# Patient Record
Sex: Female | Born: 1960 | Hispanic: Yes | Marital: Married
Health system: Southern US, Community
[De-identification: ages and names within clinical notes are randomized; demographics above are authoritative.]

---

## 2011-03-07 ENCOUNTER — Observation Stay: Payer: Self-pay | Admitting: Internal Medicine

## 2011-03-07 LAB — CBC
MCHC: 32.9 g/dL (ref 32.0–36.0)
Platelet: 202 10*3/uL (ref 150–440)
RDW: 13.4 % (ref 11.5–14.5)
WBC: 14.9 10*3/uL — ABNORMAL HIGH (ref 3.6–11.0)

## 2011-03-07 LAB — BASIC METABOLIC PANEL
Anion Gap: 12 (ref 7–16)
BUN: 10 mg/dL (ref 7–18)
Creatinine: 0.71 mg/dL (ref 0.60–1.30)
EGFR (African American): >60
EGFR (Non-African Amer.): >60
Glucose: 194 mg/dL — ABNORMAL HIGH (ref 65–99)
Sodium: 140 mmol/L (ref 136–145)

## 2011-03-07 LAB — LIPASE, BLOOD: Lipase: 174 U/L (ref 73–393)

## 2011-03-07 LAB — PREGNANCY, URINE: Pregnancy Test, Urine: NEGATIVE m[IU]/mL

## 2011-03-08 LAB — BASIC METABOLIC PANEL
Anion Gap: 9 (ref 7–16)
BUN: 7 mg/dL (ref 7–18)
Chloride: 103 mmol/L (ref 98–107)
Co2: 25 mmol/L (ref 21–32)
EGFR (African American): >60
EGFR (Non-African Amer.): >60
Glucose: 115 mg/dL — ABNORMAL HIGH (ref 65–99)
Osmolality: 273 (ref 275–301)
Potassium: 3 mmol/L — ABNORMAL LOW (ref 3.5–5.1)
Sodium: 137 mmol/L (ref 136–145)

## 2011-03-08 LAB — CBC WITH DIFFERENTIAL/PLATELET
Basophil #: 0 10*3/uL (ref 0.0–0.1)
Basophil %: 0.3 %
Eosinophil #: 0.1 10*3/uL (ref 0.0–0.7)
Eosinophil %: 0.4 %
HCT: 33.5 % — ABNORMAL LOW (ref 35.0–47.0)
Lymphocyte #: 3 10*3/uL (ref 1.0–3.6)
Lymphocyte %: 21.9 %
MCH: 30 pg (ref 26.0–34.0)
MCHC: 33.4 g/dL (ref 32.0–36.0)
Monocyte #: 0.6 10*3/uL (ref 0.0–0.7)
Neutrophil #: 9.9 10*3/uL — ABNORMAL HIGH (ref 1.4–6.5)
Neutrophil %: 72.6 %
RDW: 13 % (ref 11.5–14.5)
WBC: 13.6 10*3/uL — ABNORMAL HIGH (ref 3.6–11.0)

## 2011-03-08 LAB — MAGNESIUM: Magnesium: 1.6 mg/dL — ABNORMAL LOW

## 2013-04-15 IMAGING — CR DG CHEST 2V
1 series · 2 of 2 positions shown · non-contrast
Comparison: none

REASON FOR EXAM: CHEST PAIN
COMMENTS:

[Series 1: lat · 0.17mm/px · 2 of 2 slices shown]
[im 1/2]
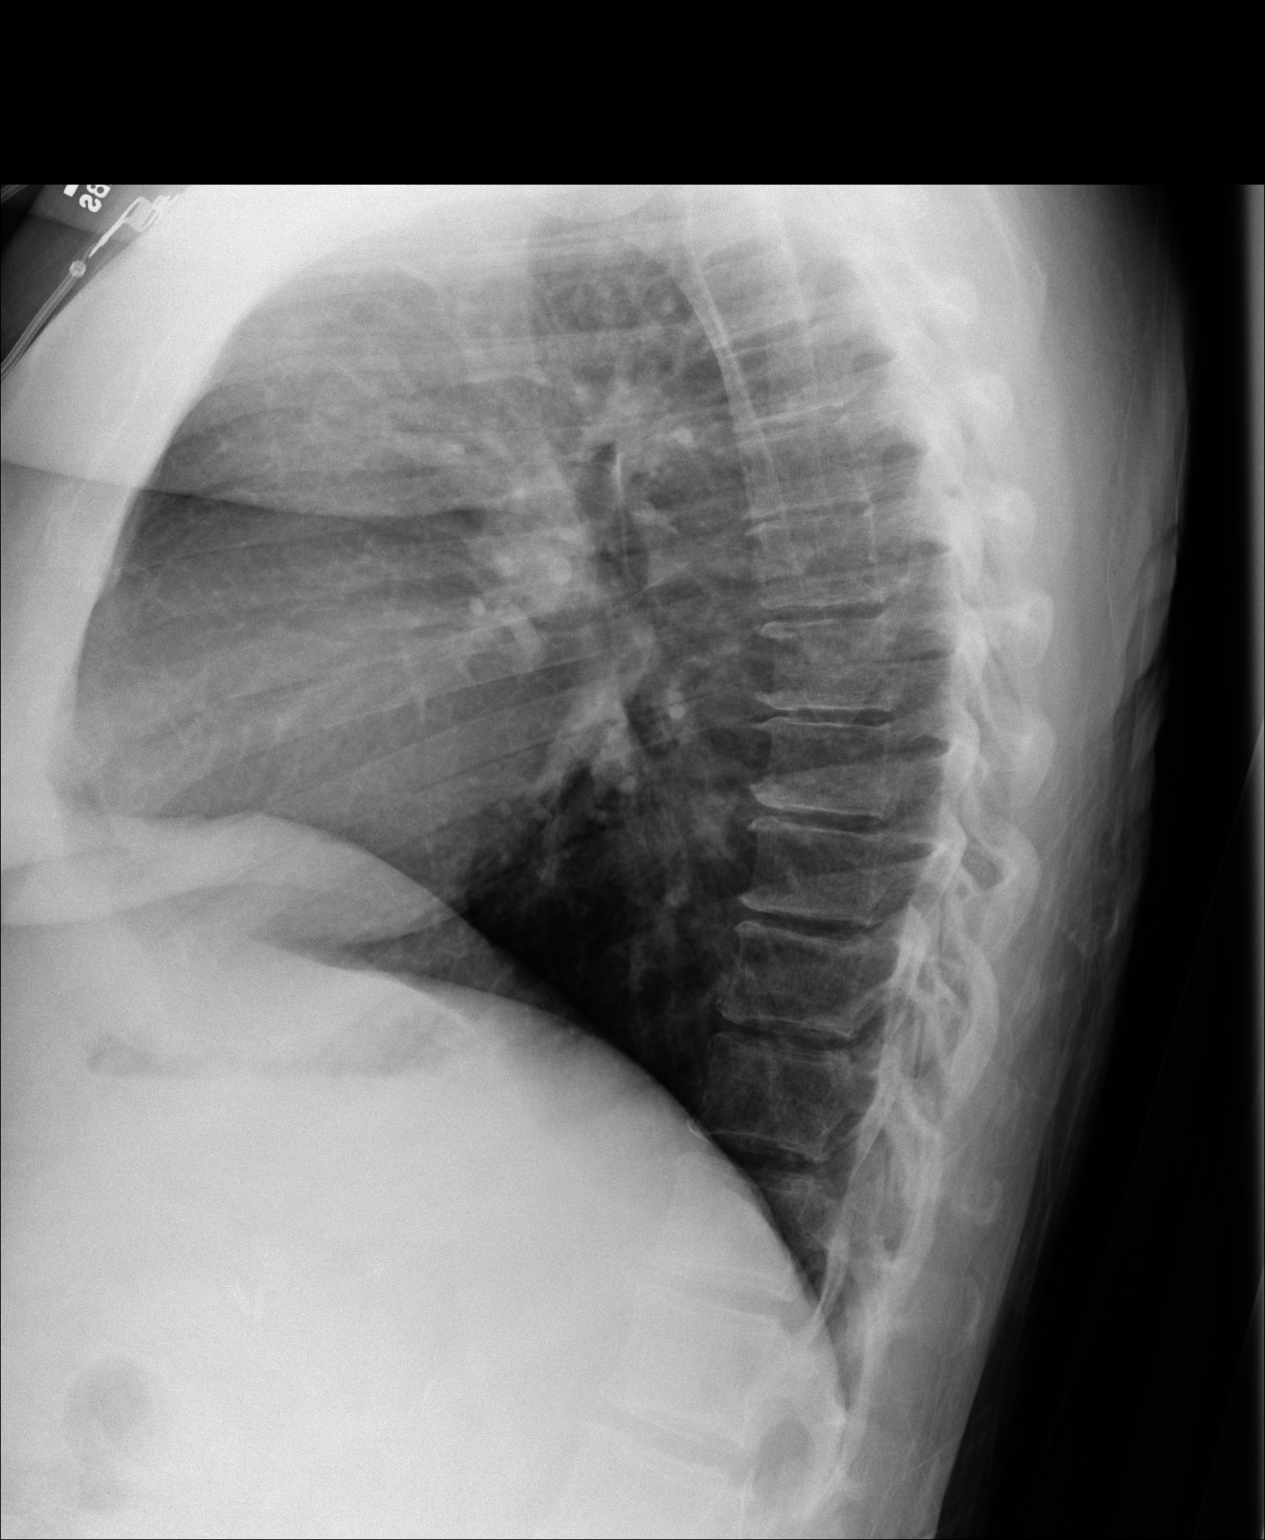
[im 2/2]
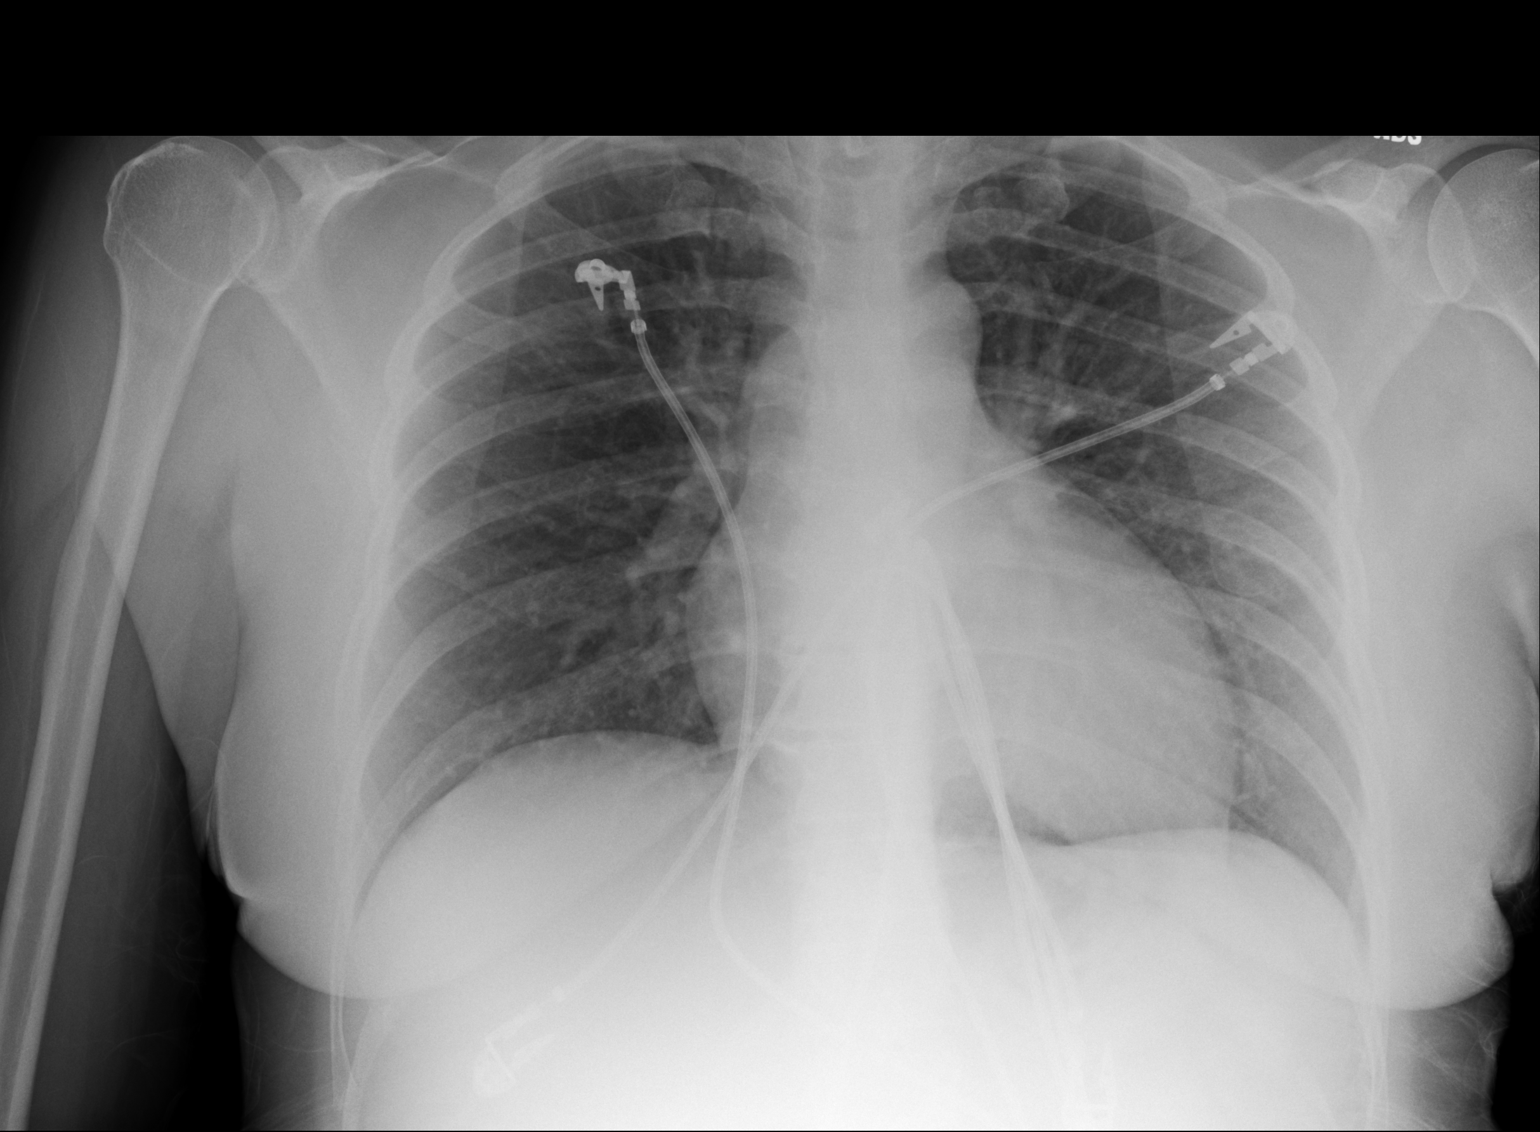

[2 of 2 positions shown; findings below may reference images not displayed]

PROCEDURE:     DXR - DXR CHEST PA (OR AP) AND LATERAL  - March 07, 2011  [DATE]

RESULT:     A cardiac monitoring electrodes are present. There is no
previous exam for comparison. The lungs are clear. The heart and pulmonary
vessels are normal. The bony and mediastinal structures are unremarkable.
There is no effusion. There is no pneumothorax or evidence of congestive
failure.
IMPRESSION: No acute cardiopulmonary disease.

## 2013-04-15 IMAGING — CT CT CHEST W/ CM
1 series · 15 of 31 positions shown, 19 images · IV contrast (APPLIED)
Comparison: none

REASON FOR EXAM: PLEURITIC CHEST PAIN AND ELEVATED DDIMER
COMMENTS:

[Series 4: soft tissue · axial · 0.79mm/px · z∈[-186,+42]mm · 15 of 84 slices shown, 19 images]
[im 4/84  mediastinal]
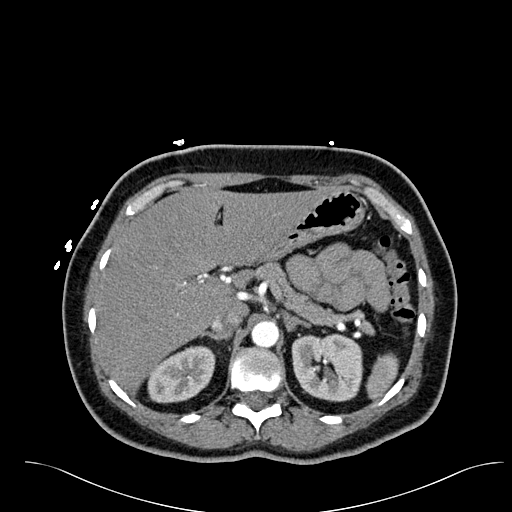
[im 4/84  lung]
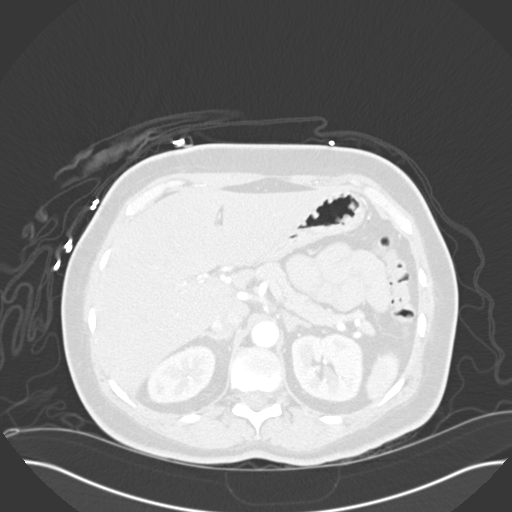
[im 10/84  lung]
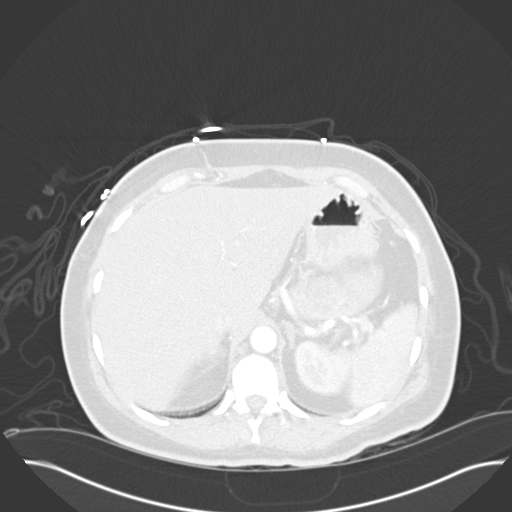
[im 16/84  lung]
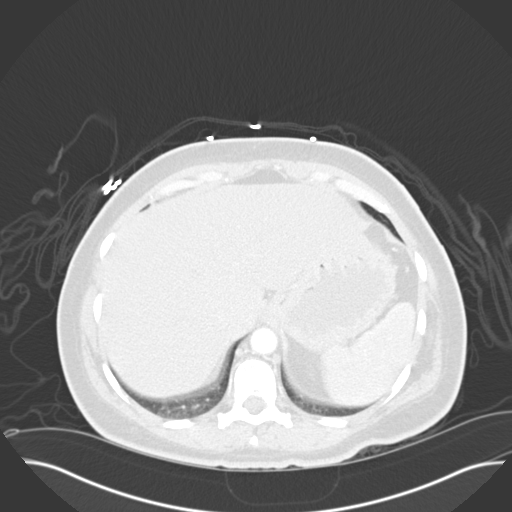
[im 19/84  lung]
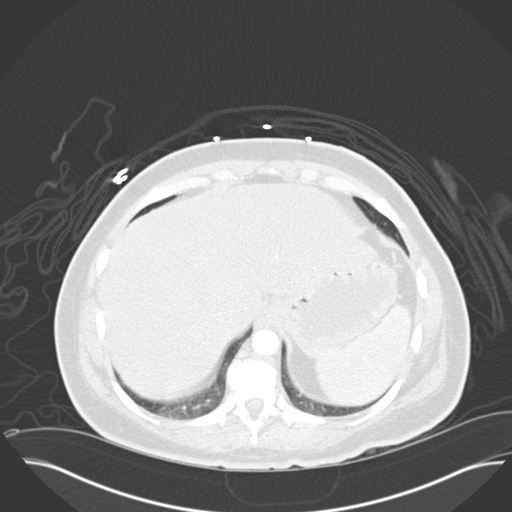
[im 25/84  mediastinal]
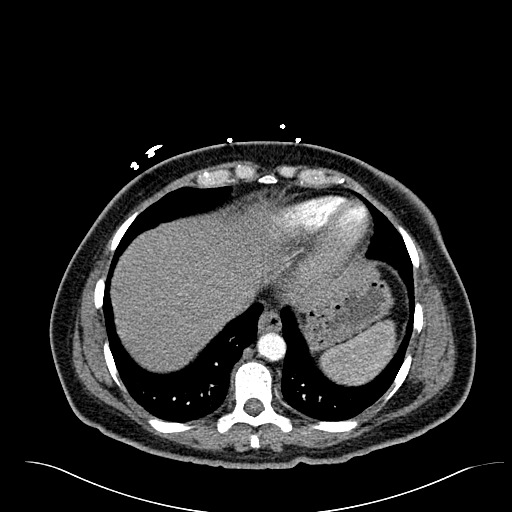
[im 25/84  lung]
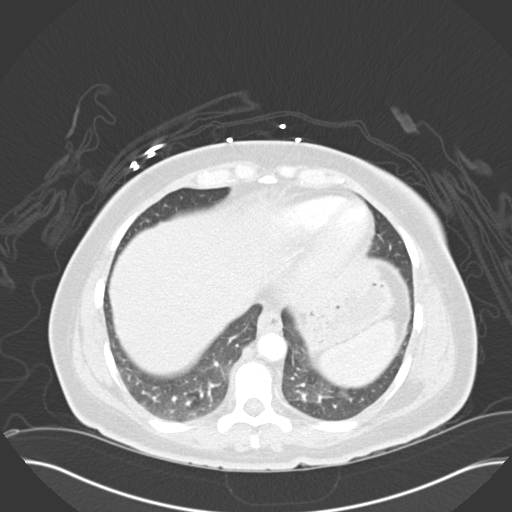
[im 31/84  lung]
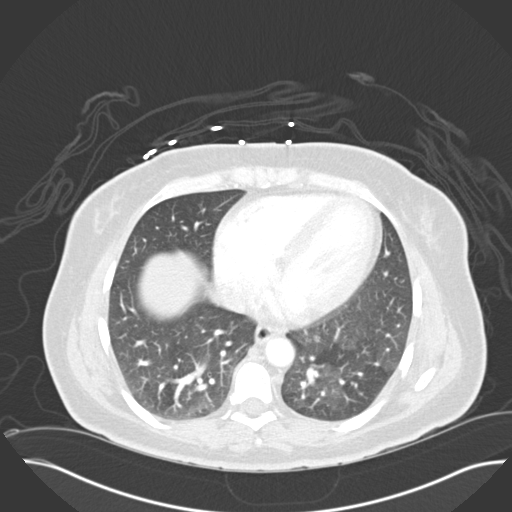
[im 37/84  lung]
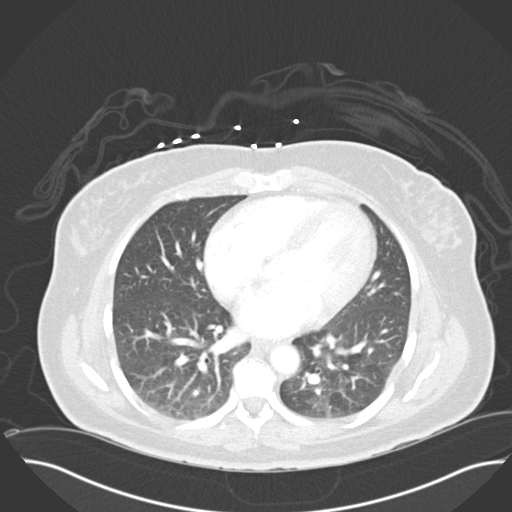
[im 44/84  lung]
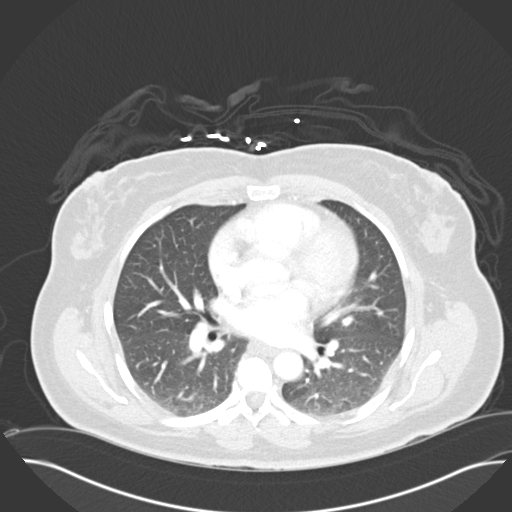
[im 47/84  mediastinal]
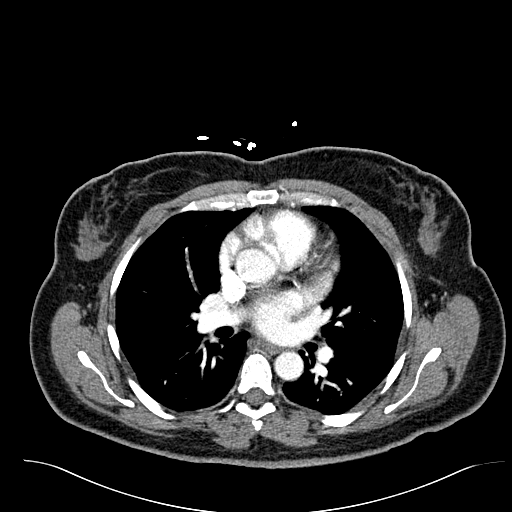
[im 47/84  lung]
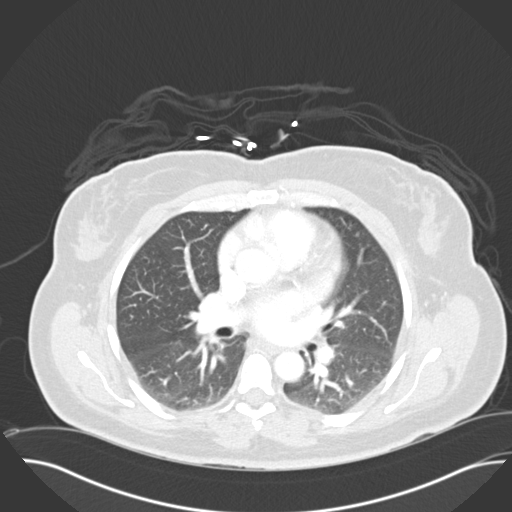
[im 53/84  lung]
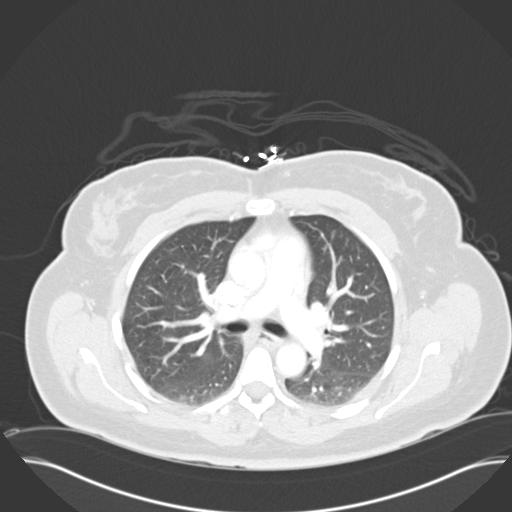
[im 59/84  lung]
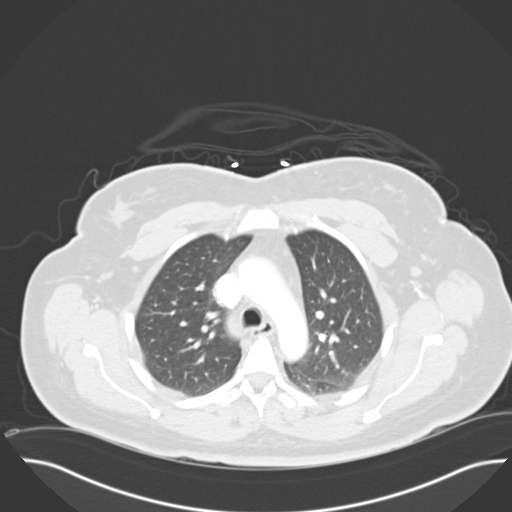
[im 65/84  lung]
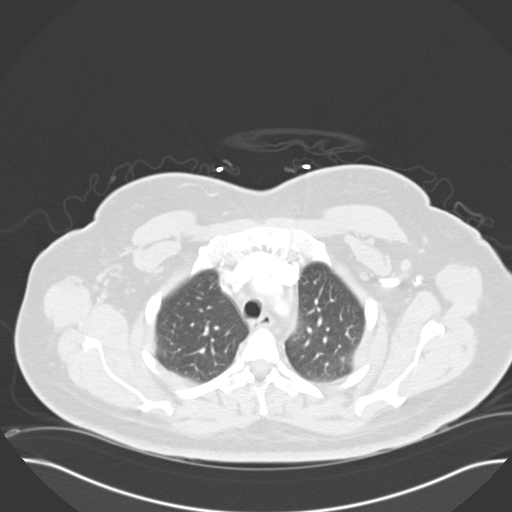
[im 68/84  mediastinal]
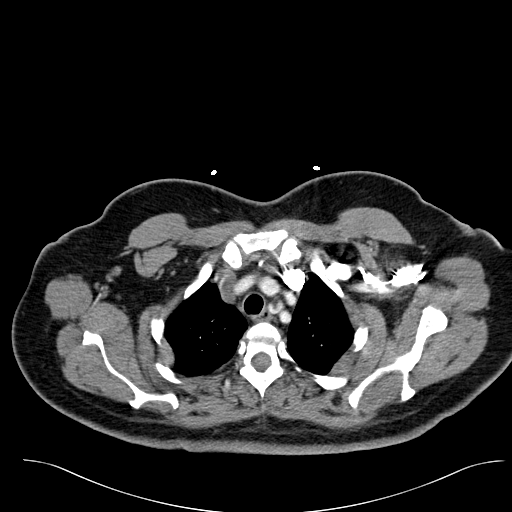
[im 68/84  lung]
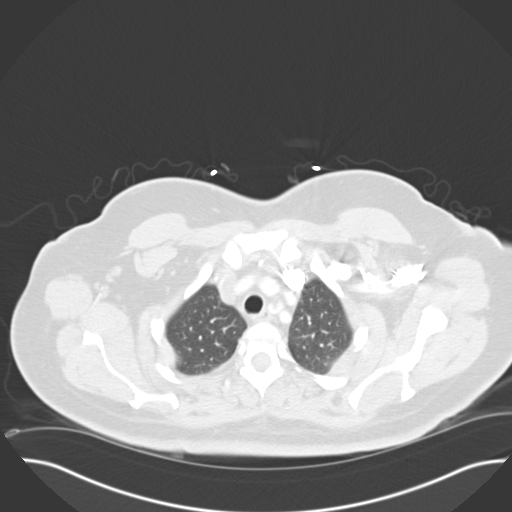
[im 74/84  lung]
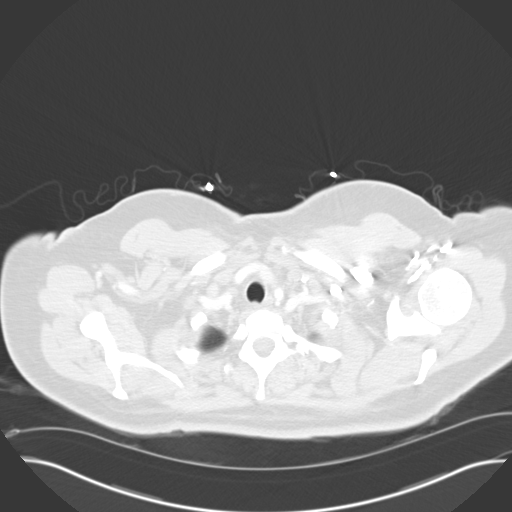
[im 80/84  lung]
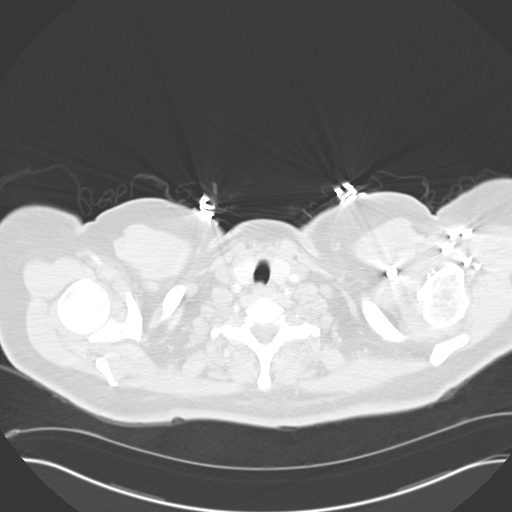

[15 of 31 positions shown; findings below may reference images not displayed]

PROCEDURE:     CT  - CT CHEST (FOR PE) W  - March 07, 2011 [DATE]

RESULT:     Axial CT scanning was performed through the chest with
reconstructions at 3 mm intervals and slice thicknesses following
intravenous administration of 75 cc of [id number]. Review of multiplanar
reconstructed images was performed separately on the VIA monitor.

Contrast within the pulmonary arterial tree is within the limits of normal.
I do not see filling defects to suggest an acute pulmonary embolism. The
cardiac chambers are normal in size. The caliber of the thoracic aorta is
normal. I see no pleural nor pericardial effusion. The thoracic esophagus
exhibits no acute abnormality. I see no mediastinal nor hilar
lymphadenopathy.

At lung window settings mildly increased interstitial density is noted in
both lungs especially in the lower lobes. There is a calcified nodule on
image 23 consistent with previous granulomatous infection.

Within the upper abdomen the observed portions of the liver and spleen and
adrenal glands exhibit no acute abnormality. The thoracic vertebral bodies
are preserved in height.
IMPRESSION: 1. I do not see evidence of an acute pulmonary embolism.
2. There is no evidence of CHF. Mildly increased interstitial density in the
dependent portion of both lungs is nonspecific.
3. I see no alveolar infiltrates. There is evidence of prior granulomatous
infection.

## 2013-04-16 IMAGING — US ABDOMEN ULTRASOUND
1 series · 17 of 25 positions shown · non-contrast
Comparison: none

REASON FOR EXAM: n/v
COMMENTS:

[Series 1: abdomen ultrasound · 17 of 66 slices shown]
[im 1/66]
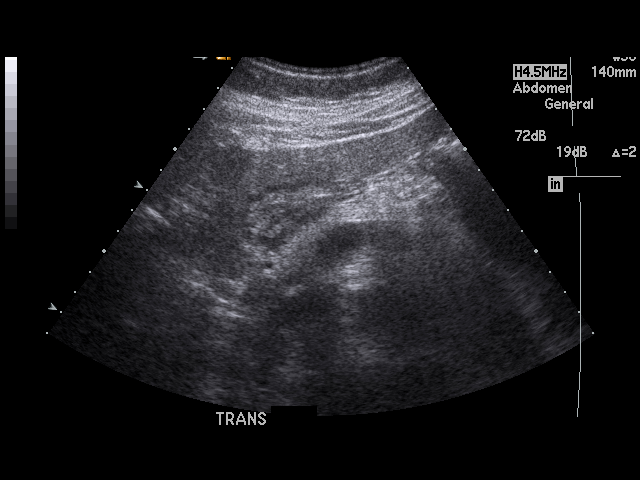
[im 6/66]
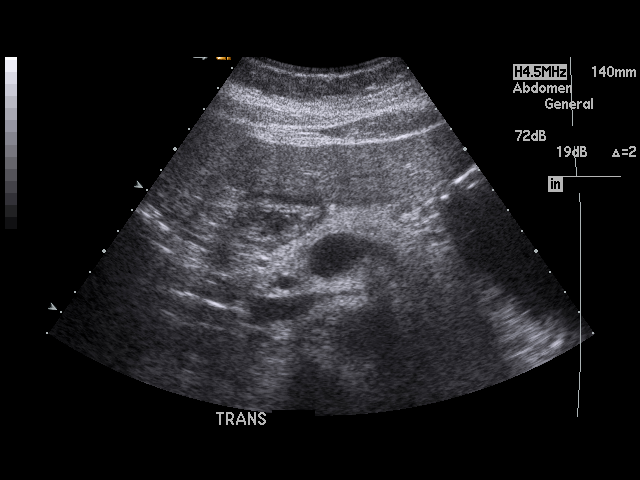
[im 9/66]
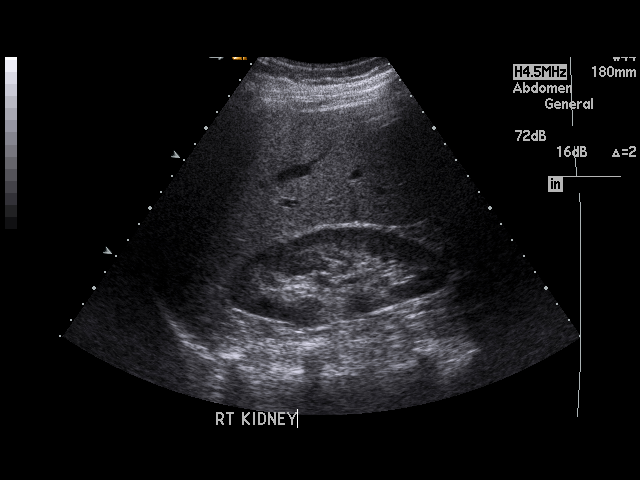
[im 14/66]
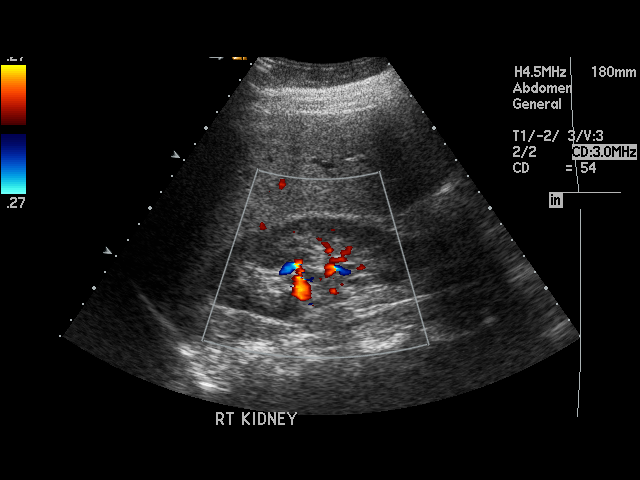
[im 17/66]
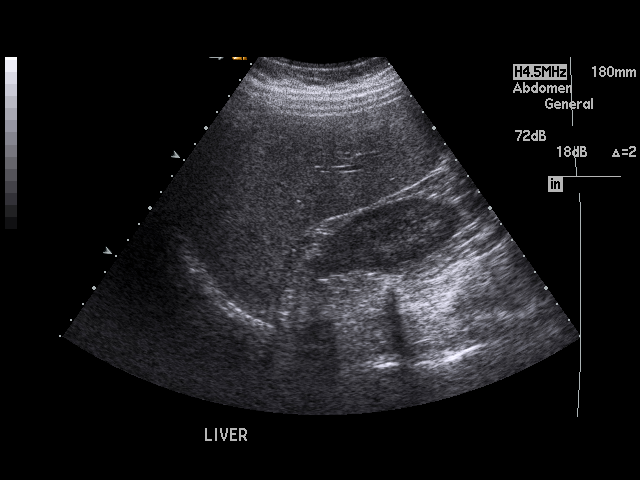
[im 22/66]
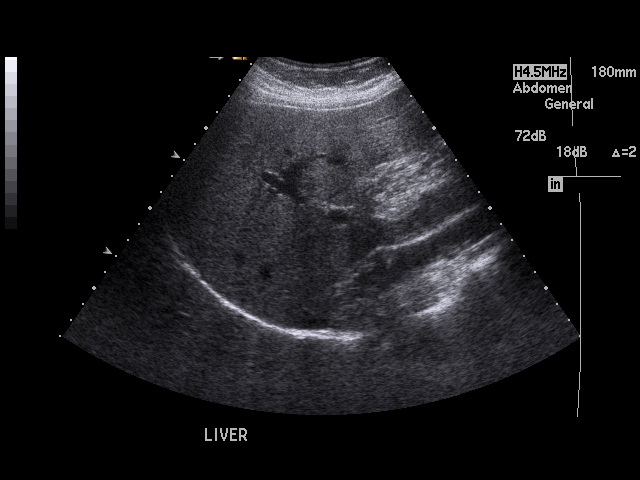
[im 25/66]
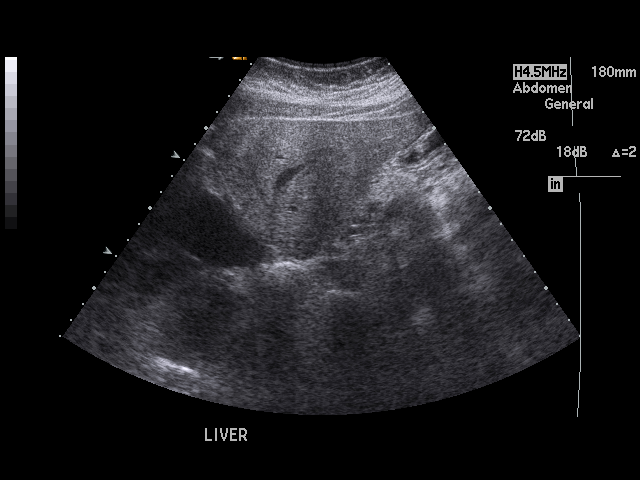
[im 30/66]
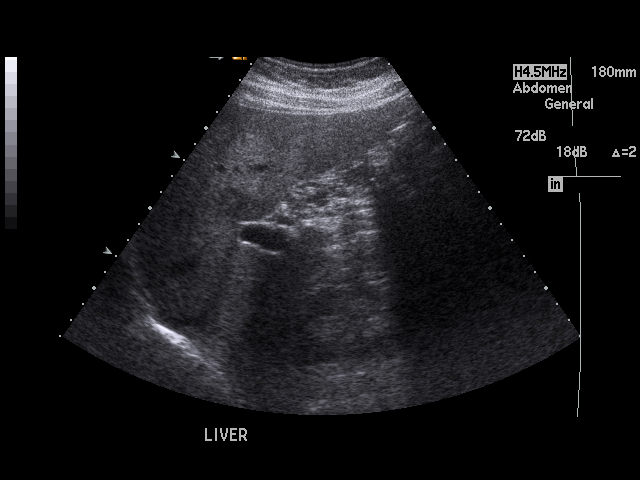
[im 33/66]
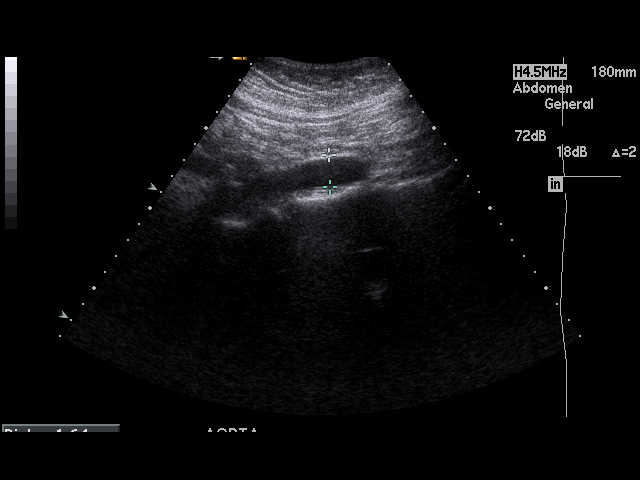
[im 36/66]
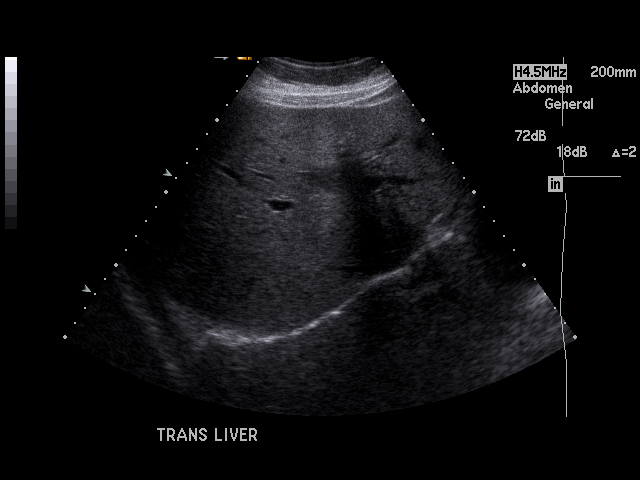
[im 41/66]
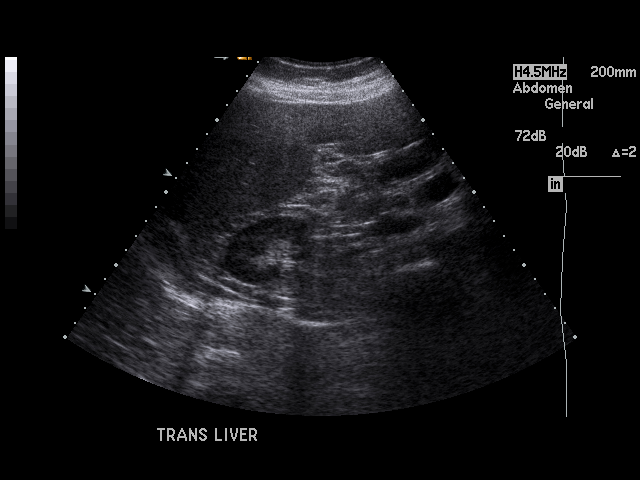
[im 44/66]
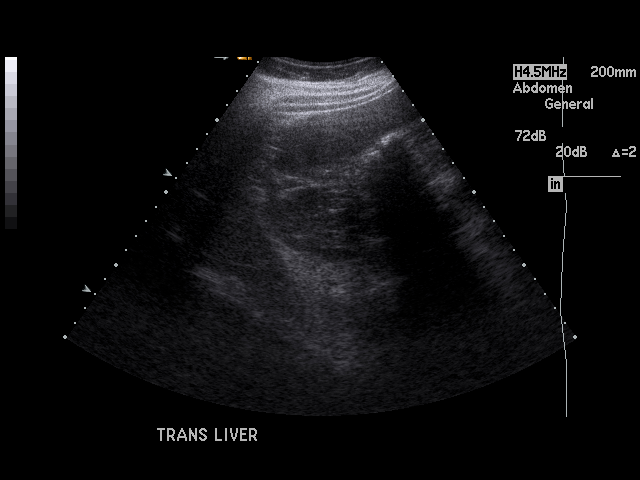
[im 49/66]
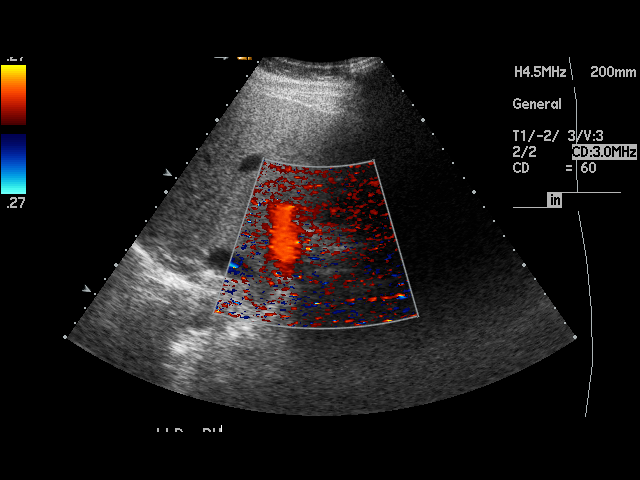
[im 52/66]
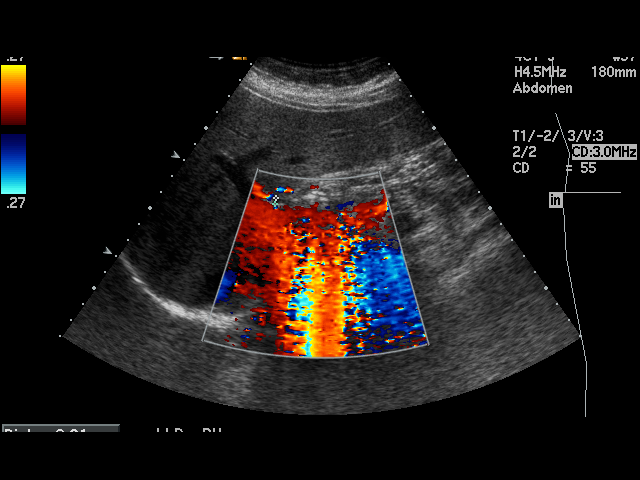
[im 57/66]
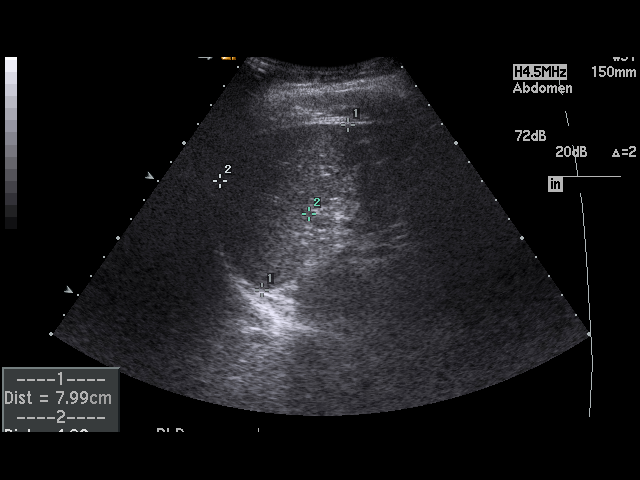
[im 60/66]
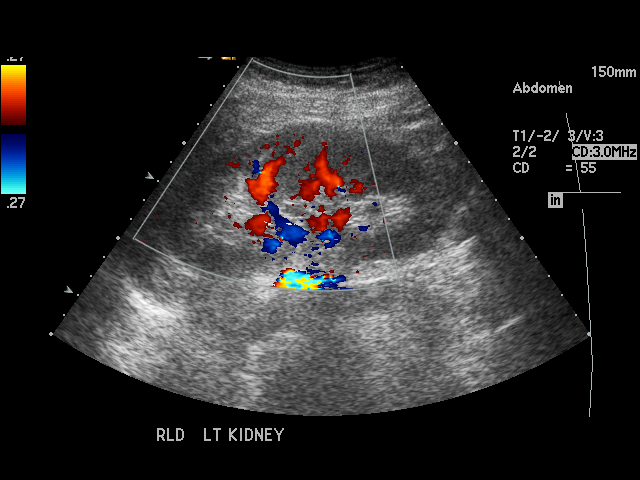
[im 66/66]
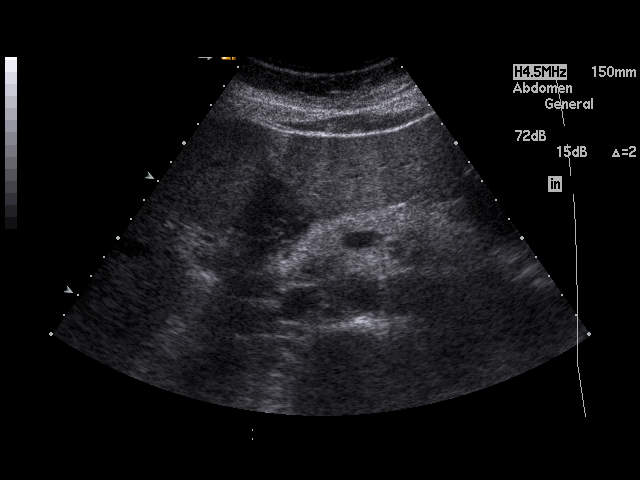

[17 of 25 positions shown; findings below may reference images not displayed]

PROCEDURE:     US  - US ABDOMEN GENERAL SURVEY  - March 08, 2011  [DATE]

RESULT:     The gallbladder is surgically absent. The liver exhibits no
focal mass or ductal dilation. The pancreas, spleen, abdominal aorta, and
inferior vena cava are normal in appearance. The common bile duct measures
4.8 mm in diameter. The kidneys exhibit normal size and echotexture with no
evidence of obstruction.
IMPRESSION: I do not see evidence of acute abnormality of the liver or
pancreas or spleen. The gallbladder is surgically absent.

## 2014-05-15 NOTE — H&P (Signed)
PATIENT NAME:  Anita Kelly Kelly, Anita Kelly MR#:  [id number] DATE OF BIRTH:  12-Sep-1960  DATE OF ADMISSION:  03/07/2011  PRIMARY CARE PHYSICIAN: Evelene CroonMeindert Niemeyer, MD  REQUESTING PHYSICIAN: Anita Kelly Rooksebecca Lord, MD  CHIEF COMPLAINT: Nausea, vomiting, diarrhea.   HISTORY OF PRESENT ILLNESS: The patient is a 54 year old female, who is not [place] speaking (Spanish speaking), who is being admitted for intractable nausea and vomiting. The patient started having chest pain, vomiting, and diarrhea along with severe headache this morning. She woke up around 3:30 and has been continuously vomiting, could not get anything orally down. She is also having headache and nausea. She was tachypneic in the emergency department with a heart rate of 120, sinus tach, and was breathing 26 per minute. She feels very weak and her family felt that they could not manage her at home. As per the report, the patient has also been feeling anxious and depressed for some time and the family has been trying to get a psychiatry doctor. As per family, she has also been tried on several different antidepressant medications with little success. She is being admitted for further evaluation and management.   PAST MEDICAL HISTORY:  1. Anxiety/depression.  2. Hypertension.  3. Diabetes.   MEDICATIONS AT HOME:  1. Xanax 1 mg p.o. b.i.d. as needed.  2. Lisinopril 40 mg p.o. daily.  3. Glyburide 20 mg p.o. daily.  4. Dexilant 60 mg p.o. daily.  5. Trazodone 50 mg p.o. at bedtime.  6. Metformin 1000 mg p.o. b.i.d.   ALLERGIES: No known drug allergies.  SOCIAL HISTORY: No smoking. No alcohol.   FAMILY HISTORY: Father with stomach ulcer. Mother with diabetes.   REVIEW OF SYSTEMS: CONSTITUTIONAL: No fever. Positive fatigue and weakness. EYES: No blurry or double vision. ENT: No tinnitus or ear pain. RESPIRATORY: No cough, wheezing, or hemoptysis. CARDIOVASCULAR: Positive for chest pain. No orthopnea or edema. GASTROINTESTINAL: Positive for nausea,  vomiting, and diarrhea. GU: No dysuria or hematuria. ENDOCRINE: No polyuria or nocturia. HEMATOLOGY: No anemia or easy bruising. SKIN: No rash or lesion. MUSCULOSKELETAL: Body ache and headache. NEUROLOGIC: No tingling, numbness, or weakness. PSYCHIATRIC: Positive anxiety and depression.   PHYSICAL EXAMINATION:   VITAL SIGNS: Temperature is 97.8, heart rate 77 per minute, respirations 26 per minute, blood pressure 174/86 mmHg, and she is saturating 100% on room air.   GENERAL: The patient is a 54 year old female lying in the bed comfortably without any acute distress.   EYES: Pupils are equally round and reactive to light and accommodation. No scleral icterus. Extraocular muscles intact.   ENT: Oropharynx and nasopharynx dry and clear.   NECK: Supple. No jugular venous distention. No thyroid enlargement or tenderness.   LUNGS: Clear to auscultation bilaterally. No wheezing, rales, rhonchi, or crepitation.   ABDOMEN: Soft, nontender, and nondistended. Bowel sounds are present. No organomegaly or masses.   EXTREMITIES: No pedal edema, cyanosis, or clubbing.   NEUROLOGIC: Nonfocal examination. Cranial nerves II through XII grossly intact. Muscle strength 5/5 in all extremities. Sensation intact.   PSYCH: The patient is oriented to time, place, and person x3.   SKIN: No obvious rash, lesion, or ulcer.   LABS/STUDIES: Normal BMP. Normal CBC except white count of 14.9. D-dimer 0.053. Negative urine pregnancy test. Negative first set of cardiac enzymes.   CT scan of the head without contrast showed no acute intracranial abnormality.   CT scan of the chest for PE protocol, on 03/07/2011, showed no acute pulmonary embolism. No evidence of congestive heart failure. No alveolar infiltrates.  Chest x-ray showed no acute cardiopulmonary disease.   EKG showed normal sinus rhythm. No major ST-T changes.  IMPRESSION AND PLAN:  1. Intractable nausea and vomiting: Her lipase is normal. This could  be viral in nature. We will provide symptomatic management for now. It could be complex migraine also. We will monitor and provide her Zofran as needed.  2. Anxiety/depression: We will start her on Xanax and Viibryd. Consult psychiatry for further evaluation, for medication adjustment.  3. Diabetes: We will hold metformin as she is not eating much. We will start her on sliding scale insulin.  4. Gastroesophageal reflux disease: We will continue Dexilant.   She will be admitted for observation. Her management plans were discussed with her daughter who was at the bedside along with her husband. Both of them do speak [place] and understand English, except the patient. Her daughter's telephone number is [phone]. Her name is Anita Kelly Kelly.    CODE STATUS: FULL CODE.   TOTAL TIME SPENT: 55 minutes.  ___________________________ Ellamae Sia. Anita Kelly Burger, MD vss:slb D: 03/07/2011 18:15:01 ET T: 03/08/2011 07:14:19 ET JOB#: [id number]  cc: Sameen Leas S. Anita Kelly Burger, MD, <Dictator> Anita Kelly A. Lacie Scotts, MD Ellamae Sia [facility] MD ELECTRONICALLY SIGNED 03/08/2011 23:01

## 2014-05-15 NOTE — Discharge Summary (Signed)
PATIENT NAME:  Anita Kelly, JASKOT MR#:  [id number] DATE OF BIRTH:  06-26-1960  DATE OF ADMISSION:  03/07/2011 DATE OF DISCHARGE:  03/08/2011  PRIMARY CARE PHYSICIAN: Lorelee Market, MD   DISCHARGE DIAGNOSES: 1. Intractable nausea and vomiting, likely viral in nature, now resolved.  2. Hypokalemia/hypomagnesemia, repleted and resolved. 3. Headache, helped with Norco, now improved.   SECONDARY DIAGNOSES:  1. Anxiety/depression.  2. Hypertension. 3. Diabetes.   CONSULTATIONS: None.   PROCEDURES/RADIOLOGY:  1. CT scan of the head without contrast on February 14th showed no acute abnormality.  2. CT scan of chest for PE protocol on February 14th showed no evidence of acute pulmonary embolism. No CHF. No alveolar infiltrate.  3. Chest x-ray on February 14th showed no acute cardiopulmonary disease.  4. Abdominal ultrasound on February 15th showed no evidence of acute abnormality. Gallbladder surgically absent.    MAJOR LABORATORY PANEL: Urine pregnancy test was negative.   HISTORY AND SHORT HOSPITAL COURSE: The patient is a 54 year old female with above-mentioned medical problems who was admitted for intractable nausea and vomiting. She was treated symptomatically, was started on clear liquid diet and slowly advanced. She tolerated advancement of diet well. She was also found to have electrolyte disturbances with hypokalemia and hypomagnesemia which was thought to be likely secondary to her ongoing vomiting and electrolytes were replaced aggressively. On February 15th, she was feeling much better. She had some headache which was improved with Norco and after discussion with family and patient she was discharged home in stable condition.   On the date of discharge, her vital signs are as follows: Temperature 96.4, heart rate 97 per minute, respirations 18 per minute, blood pressure 150/75 mmHg. She was saturating 97% on room air.   PERTINENT PHYSICAL EXAMINATION: CARDIOVASCULAR: S1, S2 normal.  No murmur, rubs, or gallop. LUNGS: Clear to auscultation bilaterally. No wheezing, rales, rhonchi, or crepitation. ABDOMEN: Soft, benign. NEUROLOGIC: Nonfocal examination. All other physical examination remained at baseline.   DISCHARGE MEDICATIONS:  1. Metformin 1000 mg p.o. b.i.d.  2. Alprazolam 1 mg p.o. b.i.d. 3. Multivitamin 1 tablet p.o. daily. 4. Viibryd 1 tablet p.o. daily (starter kit). 5. Dexilant 60 mg p.o. daily.  6. Lisinopril 40 mg p.o. daily.  7. Tylenol 500 mg 2 tablets p.o. every six hours as needed.  8. Zofran 4 mg p.o. every six hours as needed.  9. Norco 5/325 one tablet p.o. every six hours as needed (15 tablets prescription, no refill).   DISCHARGE DIET: Low sodium, 1800 ADA.   DISCHARGE ACTIVITY: As tolerated.   DISCHARGE INSTRUCTIONS AND FOLLOW-UP: The patient was instructed to follow-up with her primary care physician, Dr. Brunetta Genera, in 1 to 2 weeks.   TOTAL TIME DISCHARGING THIS PATIENT: 55 minutes.  ____________________________ Lucina Mellow. Manuella Ghazi, MD vss:drc D: 03/12/2011 09:51:31 ET T: 03/12/2011 11:20:06 ET JOB#: [id number]  cc: Cosmo Tetreault S. Manuella Ghazi, MD, <Dictator> Meindert A. Brunetta Genera, [place] MD ELECTRONICALLY SIGNED 03/12/2011 11:41
